# Patient Record
Sex: Male | Born: 1955 | Race: White | Hispanic: No | Marital: Married | Smoking: Current every day smoker
Health system: Southern US, Community
[De-identification: ages and names within clinical notes are randomized; demographics above are authoritative.]

## PROBLEM LIST (undated history)

## (undated) DIAGNOSIS — E119 Type 2 diabetes mellitus without complications: Secondary | ICD-10-CM

## (undated) DIAGNOSIS — R112 Nausea with vomiting, unspecified: Secondary | ICD-10-CM

## (undated) DIAGNOSIS — I10 Essential (primary) hypertension: Secondary | ICD-10-CM

## (undated) DIAGNOSIS — G4730 Sleep apnea, unspecified: Secondary | ICD-10-CM

## (undated) DIAGNOSIS — Z9889 Other specified postprocedural states: Secondary | ICD-10-CM

## (undated) HISTORY — PX: OTHER SURGICAL HISTORY: SHX169

---

## 2002-07-11 ENCOUNTER — Ambulatory Visit (HOSPITAL_BASED_OUTPATIENT_CLINIC_OR_DEPARTMENT_OTHER): Admission: RE | Admit: 2002-07-11 | Discharge: 2002-07-11 | Payer: Self-pay | Admitting: Emergency Medicine

## 2002-07-27 ENCOUNTER — Encounter: Payer: Self-pay | Admitting: Orthopedic Surgery

## 2002-07-30 ENCOUNTER — Ambulatory Visit (HOSPITAL_COMMUNITY): Admission: RE | Admit: 2002-07-30 | Discharge: 2002-07-31 | Payer: Self-pay | Admitting: *Deleted

## 2002-09-22 ENCOUNTER — Ambulatory Visit (HOSPITAL_BASED_OUTPATIENT_CLINIC_OR_DEPARTMENT_OTHER): Admission: RE | Admit: 2002-09-22 | Discharge: 2002-09-22 | Payer: Self-pay | Admitting: Emergency Medicine

## 2007-02-12 ENCOUNTER — Encounter: Admission: RE | Admit: 2007-02-12 | Discharge: 2007-02-12 | Payer: Self-pay | Admitting: Sports Medicine

## 2007-03-24 ENCOUNTER — Ambulatory Visit (HOSPITAL_BASED_OUTPATIENT_CLINIC_OR_DEPARTMENT_OTHER): Admission: RE | Admit: 2007-03-24 | Discharge: 2007-03-25 | Payer: Self-pay | Admitting: Orthopedic Surgery

## 2008-02-15 ENCOUNTER — Ambulatory Visit (HOSPITAL_BASED_OUTPATIENT_CLINIC_OR_DEPARTMENT_OTHER): Admission: RE | Admit: 2008-02-15 | Discharge: 2008-02-15 | Payer: Self-pay | Admitting: Orthopedic Surgery

## 2010-11-13 NOTE — Op Note (Signed)
NAMECASSANDRA, MCMANAMAN              ACCOUNT NO.:  [id number]   MEDICAL RECORD NO.:  [id number]          PATIENT TYPE:  AMB   LOCATION:  DSC                          FACILITY:  [facility]   PHYSICIAN:  Robert A. Thurston Hole, M.D. DATE OF BIRTH:  04-Sep-1955   DATE OF PROCEDURE:  02/15/2008  DATE OF DISCHARGE:                               OPERATIVE REPORT   PREOPERATIVE DIAGNOSES:  1. Left knee medial and lateral meniscal tears with chondromalacia and      synovitis.  2. Left knee gout.   POSTOPERATIVE DIAGNOSES:  1. Left knee medial and lateral meniscal tears with chondromalacia and      synovitis.  2. Left knee gout.   PROCEDURES:  1. Left knee examination under anesthesia followed by arthroscopic      partial medial and lateral meniscectomies.  2. Left knee chondroplasty with partial synovectomy.   SURGEON:  Elana Alm. Thurston Hole, MD   ANESTHESIA:  General.   OPERATIVE TIME:  40 minutes.   COMPLICATIONS:  None.   INDICATION FOR PROCEDURE:  Mr. Victor [name] is a 55 year old gentleman who has  had significant left knee pain over the past 6 to 8 weeks increasing in  nature, with exam and MRI documenting meniscal tearing with  chondromalacia, synovitis, and gout.  He has failed conservative care  and is now to undergo arthroscopy.   DESCRIPTION:  Mr. Honeyman was brought to the operating room on February 15, 2008, placed on operating table in supine position.  After an adequate  level general anesthesia was obtained his left knee was examined.  He  had full range of motion.  His knee was stable.  Ligamentous exam with  normal patellar tracking.  The knee was sterilely injected with 0.25%  Marcaine with epinephrine.  The left leg was then prepped using sterile  DuraPrep and draped using sterile technique.  Originally through an  anterolateral portal the arthroscope with a pump attached was placed in  through an anteromedial portal, and an arthroscopic probe was placed.  On initial inspection of the  medial compartment, the articular cartilage  showed 50% grade 3 chondromalacia, which was debrided.  Medial meniscus  tear, posterior and medial horn, of which 40-50% was resected back to a  stable rim.  The intercondylar notch inspected.  Anterior and posterior  crucial ligaments were normal.  Lateral compartment was inspected.  Articular cartilage which showed grade 1 and 2 chondromalacia.  There  were calcific crystal and gouty deposits on the articular surfaces  medially, laterally, and in the patellofemoral joint and in the menisci  as well, and these were partially debrided.  Lateral meniscus showed  tearing of the posterolateral corner, which was resected back to a  stable rim; 30% of the posterolateral corner was resected.  Patellofemoral joint showed 50% grade 3 chondromalacia on the  patellofemoral groove, and this was debrided.  The patella tracked  normally.  Moderate synovitis and medial and lateral gutters were  debrided, otherwise, this was free of pathology.  After this was done,  it was felt that all pathology had been satisfactorily addressed.  The  instruments were removed.  Portals were closed with 3-0 nylon suture and  injected with 0.25% Marcaine with epinephrine and 4 mg of morphine.  Sterile dressings were applied and the patient awakened and taken to the  recovery room in stable condition.   FOLLOWUP CARE:  Mr. Rowlette will be followed as an outpatient on Vicodin  and Mobic.  He will be seen back in office in a week for sutures out and  followup.      Robert A. Thurston Hole, M.D.  Electronically Signed     RAW/MEDQ  D:  02/15/2008  T:  02/16/2008  Job:  [id number]

## 2010-11-13 NOTE — Op Note (Signed)
[name], [name]              ACCOUNT NO.:  [id number]   MEDICAL RECORD NO.:  [id number]          PATIENT TYPE:  AMB   LOCATION:  DSC                          FACILITY:  [facility]   PHYSICIAN:  Robert A. Thurston Hole, M.D. DATE OF BIRTH:  1956-01-03   DATE OF PROCEDURE:  03/24/2007  DATE OF DISCHARGE:                               OPERATIVE REPORT   PREOPERATIVE DIAGNOSIS:  Right shoulder labrum tear with a partial  rotator cuff and impingement.   POSTOPERATIVE DIAGNOSES:  1. Right shoulder rotator cuff tear.  2. Right shoulder partial labrum tear.  3. Right shoulder impingement.  4. 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   PROCEDURES:  1. Right shoulder evaluation under anesthesia followed by an      arthroscopically assisted rotator cuff repair using Arthrex suture      anchors x2.  2. Right shoulder debridement partial labrum tear.  3. Right shoulder subacromial decompression.  4. Right shoulder distal clavicle excision.   SURGEON:  Elana Alm. Thurston Hole, M.D.   ASSISTANT:  Julien Girt, P.A.   ANESTHESIA:  General.   OPERATIVE TIME:  One hour and ten minutes.   COMPLICATIONS:  None.   INDICATIONS FOR PROCEDURE:  Victor [name] is a 55 year old gentleman who has  had significant right shoulder pain for the past 3 to 4 months,  increasing in nature with exam and MRI documenting partial rotator cuff  tear, partial labrum tear with impingement and AC joint arthropathy.  He  has failed conservative care and is now to undergo arthroscopy and  repair.   DESCRIPTION:  Victor [name] is brought in the operating room on March 24, 2007 after an interscalene block was placed in the holding room by  anesthesia.  He was placed on the operative table in the supine  position.  After being placed under general anesthesia, his right  shoulder was examined.  He had full range of motion and his shoulder was  stable to ligamentous exam.  He is  then placed in a beach-chair position  and his shoulder and arm were prepped using sterile DuraPrep and draped  using sterile technique.  He received Ancef 1 gram IV preoperatively for  prophylaxis.  Originally through a posterior arthroscopic portal, the  arthroscope with the pump attached was placed into an anterior portal  and arthroscopic probe was placed.  On initial inspection, the articular  cartilage in the glenohumeral joint was found to be intact.  He had  partial tearing of the anterior, superior and posterior superior labrum  25-30%, which was debrided.  The biceps tendon anchor was intact.  Biceps tendon showed partial tearing 25%, which was debrided.  The  anterior-inferior labrum and anterior-inferior glenohumeral ligament  complex was intact.  Rotator cuff had a complete tear of the  supraspinatus and a partial tear of the infraspinatus and this was  partially debrided arthroscopically.  Inferior capsular recess is free  of pathology.  Subacromial space was entered and a lateral arthroscopic  portal was made.  Moderately thickened bursitis was resected.  The  rotator  cuff tear was well visualized and was further debrided  arthroscopically.  Impingement was noted and a subacromial decompression  was carried out removing 6-8 mm of the undersurface of the anterior,  anterolateral and anteromedial acromion and CA ligament release carried  out as well.  The [facility] joint showed significant  spurring and degenerative  changes in distal 6 mm of the clavicle was resected with a 6-mm bur.  After this was done, then through an accessory lateral portal, 2  separate Arthrex 5.5 mm suture anchors were placed in the greater  tuberosity.  Each of these anchors had 2 sutures in them and each of  these sutures was passed arthroscopically through the rotator cuff tear  and then tied down arthroscopically with a firm and tight repair.  After  this was done, the shoulder could be brought through a  full range of  motion with no impingement on the repair.   At this point, it was felt that all pathology had been satisfactorily  addressed.  The instruments were removed.  Portals were closed with 3-0  nylon suture.  Sterile dressings and a sling applied.  The patient  awakened and taken to the recovery room in stable condition.   FOLLOW-UP CARE:  Victor [name] will be followed as an outpatient on Percocet  and Robaxin with early physical therapy.  See me back in the office in a  week for sutures out and followup.      Robert A. Thurston Hole, M.D.  Electronically Signed     RAW/MEDQ  D:  03/24/2007  T:  03/24/2007  Job:  [id number]

## 2010-11-16 NOTE — Op Note (Signed)
NAME:  Victor [name], Victor [name]                        ACCOUNT NO.:  [id number]   MEDICAL RECORD NO.:  [id number]                   PATIENT TYPE:  OIB   LOCATION:  4731                                 FACILITY:  [facility]   PHYSICIAN:  Robert A. Thurston Hole, M.D.              DATE OF BIRTH:  1956-06-02   DATE OF PROCEDURE:  07/30/2002  DATE OF DISCHARGE:                                 OPERATIVE REPORT   PREOPERATIVE DIAGNOSIS:  Left shoulder rotator cuff tear.   POSTOPERATIVE DIAGNOSES:  Left shoulder rotator cuff tear with partial  glenoid ligament tear and impingement.   PROCEDURE:  1. Left shoulder EUA followed by arthroscopic partial ligament tear     debridement.  2. Left shoulder rotator cuff repair using Arthrex suture anchors x2.  3. Left shoulder subacromial decompression.   SURGEON:  Dr. Salvatore Marvel.   ASSISTANT:  Kirstin Tomasa Rand, P.A.   ANESTHESIA:  General.   OPERATIVE TIME:  One hour and 20 minutes.   COMPLICATIONS:  None.   DESCRIPTION OF PROCEDURE:  Mr. Mathenia was brought to the operating room on  July 30, 2002 after a intra ganglion block had been placed by Anesthesia  in the holding room.  He was placed on the operative table in supine  position.  After being placed under general anesthesia his left shoulder was  examined under anesthesia.  He had full range of motion and his shoulder was  stable to ligamentous exam.  He was then placed in beach chair position and  his shoulder and arm were prepped using Duraprep and draped using sterile  technique.  Initially through a posterior arthroscopic portal the  arthroscope with a pump attached was placed in to an angio portal and  arthroscopic probe was placed.  On initial inspection the articular  cartilage in the glenohumeral joint was intact.  Anterior labrum showed  partial tearing 25% which was debrided.  Inferior labrum and anterior  inferior glenohumeral ligament complex was intact.  The superior labrum  biceps  tendon anchor was intact, biceps tendon was intact.  The posterior  labrum showed no evidence of pathology.  Rotator cuff showed a complete tear  of the supraspinatus, partial tear of the subscapularis and infraspinatus  and this was partially debrided arthroscopically.  A lateral arthroscopic  portal was made and the subacromial space was entered.  Moderately thickened  bursitis was resected.  The rotator cuff tear was well visualized and  further debrided arthroscopically.  Subacromial decompression was carried  out removing six to eight millimeters of the under surface of the anterior,  anterior lateral and anterior medial acromion and CA ligament release  carried out as well.  The [facility] joint was not disturbed.  After this was done  the lateral portal was converted to a four centimeter deltoid splitting  longitudinal incision.  The underlying subcutaneous tissues were incised  mild skin incision.  The subacromial space was  entered.  The rotator cuff  tear was well visualized.  Two separate Arthrex suture anchors were placed  in the greater tuberosity and each of these had the sutures passed through  the rotator cuff tear and tied down securing the rotator cuff back down to  the greater tuberosity.  The subscapularis and supraspinatus tendons were  also tied separately and sutured to each other side to side with #2  Fibrewire suture.  After this was done the shoulder could be brought through  a full range of motion with no impingement on the repair.  The wound was  then irrigated and closed using 0 Vicryl and closed the deltoid fascia with  2-0 Vicryl and closed the subcutaneous tissues with 3-0 Prolene for the  subcuticular layer.  Arthroscopic portal was closed with 3-0 nylon.  Sterile  dressings and a sling applied and then the patient awakened and taken to the  recovery room in stable condition.  Needle and sponge counts correct x2 at  the end of the case.                                                Robert A. Thurston Hole, M.D.    RAW/MEDQ  D:  07/30/2002  T:  07/30/2002  Job:  [id number]

## 2011-03-29 LAB — BASIC METABOLIC PANEL
BUN: 15
CO2: 25
Calcium: 9.1
Chloride: 107
Creatinine, Ser: 0.89
GFR calc Af Amer: >60
GFR calc non Af Amer: >60
Glucose, Bld: 137 — ABNORMAL HIGH
Potassium: 3.9
Sodium: 139

## 2011-04-11 LAB — POCT HEMOGLOBIN-HEMACUE
Hemoglobin: 15.5
Operator id: 112821

## 2011-04-11 LAB — BASIC METABOLIC PANEL
BUN: 14
CO2: 26
Calcium: 9.3
Chloride: 104
Creatinine, Ser: 0.92
GFR calc Af Amer: >60
GFR calc non Af Amer: >60
Glucose, Bld: 105 — ABNORMAL HIGH
Potassium: 3.8
Sodium: 137

## 2015-07-06 ENCOUNTER — Other Ambulatory Visit: Payer: Self-pay | Admitting: Gastroenterology

## 2015-07-19 ENCOUNTER — Encounter (HOSPITAL_COMMUNITY): Payer: Self-pay | Admitting: *Deleted

## 2015-07-24 ENCOUNTER — Encounter (HOSPITAL_COMMUNITY): Payer: Self-pay | Admitting: Anesthesiology

## 2015-07-24 NOTE — Anesthesia Preprocedure Evaluation (Addendum)
Anesthesia Evaluation  Patient identified by MRN, date of birth, ID band Patient awake    Reviewed: Allergy & Precautions, NPO status , Patient's Chart, lab work & pertinent test results  History of Anesthesia Complications (+) PONV and history of anesthetic complications  Airway Mallampati: II  TM Distance: >3 FB Neck ROM: Full    Dental no notable dental hx.    Pulmonary sleep apnea , Current Smoker,    Pulmonary exam normal breath sounds clear to auscultation       Cardiovascular hypertension, Pt. on medications Normal cardiovascular exam Rhythm:Regular Rate:Normal     Neuro/Psych negative neurological ROS  negative psych ROS   GI/Hepatic negative GI ROS, Neg liver ROS,   Endo/Other  diabetes, Type 2, Oral Hypoglycemic Agents  Renal/GU negative Renal ROS  negative genitourinary   Musculoskeletal negative musculoskeletal ROS (+)   Abdominal   Peds negative pediatric ROS (+)  Hematology negative hematology ROS (+)   Anesthesia Other Findings   Reproductive/Obstetrics negative OB ROS                            Anesthesia Physical Anesthesia Plan  ASA: III  Anesthesia Plan: MAC   Post-op Pain Management:    Induction: Intravenous  Airway Management Planned: Natural Airway  Additional Equipment:   Intra-op Plan:   Post-operative Plan:   Informed Consent: I have reviewed the patients History and Physical, chart, labs and discussed the procedure including the risks, benefits and alternatives for the proposed anesthesia with the patient or authorized representative who has indicated his/her understanding and acceptance.   Dental advisory given  Plan Discussed with: CRNA  Anesthesia Plan Comments:         Anesthesia Quick Evaluation

## 2015-07-25 ENCOUNTER — Encounter (HOSPITAL_COMMUNITY): Payer: Self-pay

## 2015-07-25 ENCOUNTER — Ambulatory Visit (HOSPITAL_COMMUNITY): Payer: BLUE CROSS/BLUE SHIELD | Admitting: Anesthesiology

## 2015-07-25 ENCOUNTER — Encounter (HOSPITAL_COMMUNITY)
Admission: RE | Disposition: A | Discharge status: Home / Self Care | Payer: Self-pay | Source: Ambulatory Visit | Attending: Gastroenterology

## 2015-07-25 ENCOUNTER — Ambulatory Visit (HOSPITAL_COMMUNITY)
Admission: RE | Admit: 2015-07-25 | Discharge: 2015-07-25 | Disposition: A | Discharge status: Home / Self Care | Payer: BLUE CROSS/BLUE SHIELD | Source: Ambulatory Visit | Attending: Gastroenterology | Admitting: Gastroenterology

## 2015-07-25 DIAGNOSIS — D124 Benign neoplasm of descending colon: Secondary | ICD-10-CM | POA: Diagnosis not present

## 2015-07-25 DIAGNOSIS — I10 Essential (primary) hypertension: Secondary | ICD-10-CM | POA: Insufficient documentation

## 2015-07-25 DIAGNOSIS — Z6841 Body Mass Index (BMI) 40.0 and over, adult: Secondary | ICD-10-CM | POA: Diagnosis not present

## 2015-07-25 DIAGNOSIS — G4730 Sleep apnea, unspecified: Secondary | ICD-10-CM | POA: Insufficient documentation

## 2015-07-25 DIAGNOSIS — K5730 Diverticulosis of large intestine without perforation or abscess without bleeding: Secondary | ICD-10-CM | POA: Diagnosis not present

## 2015-07-25 DIAGNOSIS — F17210 Nicotine dependence, cigarettes, uncomplicated: Secondary | ICD-10-CM | POA: Insufficient documentation

## 2015-07-25 DIAGNOSIS — Z7984 Long term (current) use of oral hypoglycemic drugs: Secondary | ICD-10-CM | POA: Diagnosis not present

## 2015-07-25 DIAGNOSIS — Z79899 Other long term (current) drug therapy: Secondary | ICD-10-CM | POA: Insufficient documentation

## 2015-07-25 DIAGNOSIS — D122 Benign neoplasm of ascending colon: Secondary | ICD-10-CM | POA: Diagnosis not present

## 2015-07-25 DIAGNOSIS — Z86010 Personal history of colonic polyps: Secondary | ICD-10-CM | POA: Diagnosis not present

## 2015-07-25 DIAGNOSIS — Z1211 Encounter for screening for malignant neoplasm of colon: Secondary | ICD-10-CM | POA: Diagnosis present

## 2015-07-25 DIAGNOSIS — Z7982 Long term (current) use of aspirin: Secondary | ICD-10-CM | POA: Diagnosis not present

## 2015-07-25 DIAGNOSIS — E119 Type 2 diabetes mellitus without complications: Secondary | ICD-10-CM | POA: Diagnosis not present

## 2015-07-25 HISTORY — PX: COLONOSCOPY WITH PROPOFOL: SHX5780

## 2015-07-25 HISTORY — DX: Essential (primary) hypertension: I10

## 2015-07-25 HISTORY — DX: Nausea with vomiting, unspecified: Z98.890

## 2015-07-25 HISTORY — DX: Type 2 diabetes mellitus without complications: E11.9

## 2015-07-25 HISTORY — DX: Nausea with vomiting, unspecified: R11.2

## 2015-07-25 HISTORY — DX: Sleep apnea, unspecified: G47.30

## 2015-07-25 LAB — GLUCOSE, CAPILLARY: GLUCOSE-CAPILLARY: 105 mg/dL — AB (ref 65–99)

## 2015-07-25 SURGERY — COLONOSCOPY WITH PROPOFOL
Anesthesia: Monitor Anesthesia Care

## 2015-07-25 MED ORDER — SODIUM CHLORIDE 0.9 % IV SOLN: INTRAVENOUS | Status: DC

## 2015-07-25 MED ORDER — PROPOFOL 10 MG/ML IV BOLUS
INTRAVENOUS | Status: DC | PRN
  Administered 2015-07-25: 40 mg via INTRAVENOUS
  Administered 2015-07-25: 30 mg via INTRAVENOUS
  Administered 2015-07-25: 60 mg via INTRAVENOUS
  Administered 2015-07-25: 20 mg via INTRAVENOUS
  Administered 2015-07-25: 30 mg via INTRAVENOUS
  Administered 2015-07-25: 40 mg via INTRAVENOUS
  Administered 2015-07-25: 30 mg via INTRAVENOUS

## 2015-07-25 MED ORDER — EPHEDRINE SULFATE 50 MG/ML IJ SOLN
INTRAMUSCULAR | Status: AC
  Filled 2015-07-25: qty 1

## 2015-07-25 MED ORDER — LIDOCAINE HCL (CARDIAC) 20 MG/ML IV SOLN
INTRAVENOUS | Status: AC
  Filled 2015-07-25: qty 5

## 2015-07-25 MED ORDER — LACTATED RINGERS IV SOLN
INTRAVENOUS | Status: DC
  Administered 2015-07-25: 07:00:00 via INTRAVENOUS

## 2015-07-25 MED ORDER — SODIUM CHLORIDE 0.9 % IJ SOLN
INTRAMUSCULAR | Status: AC
  Filled 2015-07-25: qty 10

## 2015-07-25 MED ORDER — LIDOCAINE HCL (CARDIAC) 20 MG/ML IV SOLN
INTRAVENOUS | Status: DC | PRN
  Administered 2015-07-25: 50 mg via INTRAVENOUS

## 2015-07-25 MED ORDER — PROPOFOL 10 MG/ML IV BOLUS
INTRAVENOUS | Status: AC
  Filled 2015-07-25: qty 60

## 2015-07-25 SURGICAL SUPPLY — 21 items

## 2015-07-25 NOTE — Transfer of Care (Signed)
Immediate Anesthesia Transfer of Care Note  Patient: Victor [name]  Procedure(s) Performed: Procedure(s): COLONOSCOPY WITH PROPOFOL (N/A)  Patient Location: PACU  Anesthesia Type:MAC  Level of Consciousness: Patient easily awoken, sedated, comfortable, cooperative, following commands, responds to stimulation.   Airway & Oxygen Therapy: Patient spontaneously breathing, ventilating well, oxygen via simple oxygen mask.  Post-op Assessment: Report given to PACU RN, vital signs reviewed and stable, moving all extremities.   Post vital signs: Reviewed and stable.  Complications: No apparent anesthesia complications

## 2015-07-25 NOTE — Anesthesia Postprocedure Evaluation (Signed)
Anesthesia Post Note  Patient: Victor [name]  Procedure(s) Performed: Procedure(s) (LRB): COLONOSCOPY WITH PROPOFOL (N/A)  Patient location during evaluation: PACU Anesthesia Type: MAC Level of consciousness: awake and alert Pain management: pain level controlled Vital Signs Assessment: post-procedure vital signs reviewed and stable Respiratory status: spontaneous breathing, nonlabored ventilation, respiratory function stable and patient connected to nasal cannula oxygen Cardiovascular status: stable and blood pressure returned to baseline Anesthetic complications: no    Last Vitals:  Filed Vitals:   07/25/15 0810 07/25/15 0820  BP: 146/74 142/60  Pulse: 74 65  Temp:    Resp: 27 17    Last Pain: There were no vitals filed for this visit.               Victor [name] J

## 2015-07-25 NOTE — Discharge Instructions (Signed)

## 2015-07-25 NOTE — Op Note (Signed)
[facility] [place], [id number]   OPERATIVE PROCEDURE REPORT  PATIENT: Victor [name], Victor [name]  MR#: [id number] BIRTHDATE: 23-Apr-1956 GENDER: male ENDOSCOPIST: Edmonia James, MD ASSISTANT:   William Dalton, technician & Laverta Baltimore, RN. PROCEDURE DATE: 2015/08/03 PRE-PROCEDURE PREPARATION: Patient fasted for 4 hours prior to procedure. The patient was prepped with a gallon of Golytely the night prior to the procedure. PRE-PROCEDURE PHYSICAL: Patient has stable vital signs.  Neck is supple.  There is no JVD, thyromegaly or LAD.  Chest clear to auscultation.  S1 and S2 regular.  Abdomen soft, non-distended, non-tender with NABS. PROCEDURE:     Colonoscopy with cold biopsies x 2 and cold snare polypectomy x 1. ASA CLASS:     Class III INDICATIONS:     1.  Colorectal cancer screening-average risk patient for colon cancer. MEDICATIONS:     Monitored anesthesia care  DESCRIPTION OF PROCEDURE: After the risks, benefits, and alternatives of the procedure were thoroughly explained [including a 10% missed rate of cancer and polyps], informed consent was obtained. Digital rectal exam was performed. The Pentax video colonoscpe [id number]  was introduced through the anus  and advanced to the cecum , limited by No adverse events experienced.   The quality of the prep was adequate. Multiple washes were done. Small lesions could be missed. The instrument was then slowly withdrawn as the colon was fully examined. Estimated blood loss is zero unless otherwise noted in this procedure report.     COLON FINDINGS: A 6 mm sessile polyp was found in the proximal ascending colon. A polypectomy was performed with a cold snare. The resection was complete, the polyp tissue was completely retrieved and sent to histology.   There was moderate diverticulosis noted in the sigmoid colon. Two dimunitive polyps were removed by cold biopsies from the sigmoid colon. The rest  of the colonic mucosa appeared healthy with a normal vascular pattern. No masses or AVMs were noted.  The appendiceal orifice and the ICV were identified and photographed. Retroflexed views revealed no abnormalities. The patient tolerated the procedure without immediate complications.  The scope was then withdrawn from the patient and the procedure terminated.   TIME TO CECUM:   3 minutes 00 seconds WITHDRAW TIME:  9 minutes 00 seconds  IMPRESSION:     1.  One 6 mm sessile polyp was found in the proximal ascending colon; polypectomy was performed with a cold snare 2.  Two dimunitive polyps in the sigmoid colon removed by cold biopsies x 2. 3    Moderate sigmoid divertiuclosis.  RECOMMENDATIONS:     1.  Hold Aspirin and all other NSAIDS for 2 weeks. 2.  Continue current medications. 3.  Continue surveillance. 4.  High fiber, low fat diet with liberal fluid intake. 5.  OP follow-up is advised on a PRN basis.  REPEAT EXAM:      In 5 years  for a repeat colonoscopy.  If the patient has any abnormal GI symptoms in the interim, he have been advised to contact the office as soon as possible for further recommendations.   REFERRED BY:  Gwenlyn Perking NP eSigned:  Edmonia James, MD 08-03-2015 8:13 AM  CPT CODES:     [phone] Colonoscopy, flexible, proximal to splenic flexure; with removal of tumor(s), polyp(s), or other lesion(s) by snare technique 45380 Colonoscopy, flexible, proximal to splenic flexure; with biopsy, single or multiple ICD CODES:     Z12.11 Encounter for screening for malignant neoplasm of colon  D12.2 Benign neoplasm of ascending colon, K57.30 Dverticulosis  The ICD and CPT codes recommended by this software are interpretations from the data that the clinical staff has captured with the software.  The verification of the translation of this report to the ICD and CPT codes and modifiers is the sole responsibility of the health care institution and  practicing physician where this report was generated.  [employer] will not be held responsible for the validity of the ICD and CPT codes included on this report.  AMA assumes no liability for data contained or not contained herein. CPT is a Designer, television/film set of the [employer].  PATIENT NAME:  Victor [name], Victor [name] MR#: [id number]

## 2015-07-25 NOTE — H&P (Signed)
Victor [name] is an 60 y.o. male.   Chief Complaint: Colorectal cancer screening. HPI: 60 year old,orbidly obese white male here for a screening colonoscopy. He has had a tubular adenoma removed in 2000. See office notes for details   Past Medical History  Diagnosis Date  . Hypertension   . Diabetes mellitus without complication ([place])   . Sleep apnea     uses setting 11.5  . PONV (postoperative nausea and vomiting)     Morbid obesity Past Surgical History  Procedure Laterality Date  . Rotator cuff Bilateral   . Meniscus tendon repair Left    History reviewed. No pertinent family history. Social History:  reports that he has been smoking Cigarettes.  He has a 60 pack-year smoking history. He has never used smokeless tobacco. He reports that he drinks alcohol. He reports that he does not use illicit drugs.  Allergies: No Known Allergies  Medications Prior to Admission  Medication Sig Dispense Refill  . amLODipine (NORVASC) 10 MG tablet Take 10 mg by mouth daily.     Marland Kitchen aspirin EC 81 MG tablet Take 81 mg by mouth daily.    Marland Kitchen dextromethorphan (DELSYM) 30 MG/5ML liquid Take 30 mg by mouth 2 (two) times daily as needed for cough.    Marland Kitchen ibuprofen (ADVIL,MOTRIN) 200 MG tablet Take 400 mg by mouth every 6 (six) hours as needed (Pain).    Marland Kitchen losartan (COZAAR) 100 MG tablet Take 100 mg by mouth daily.     . metFORMIN (GLUCOPHAGE-XR) 500 MG 24 hr tablet Take 500 mg by mouth daily with breakfast.     . Pseudoephedrine-APAP-DM (DAYQUIL MULTI-SYMPTOM COLD/FLU PO) Take 1 tablet by mouth daily as needed (Cold symptoms).    . tolnaftate (JOCK ITCH SPRAY) 1 % spray Apply 1 application topically 2 (two) times daily as needed (Jock Itch).      Results for orders placed or performed during the hospital encounter of 07/25/15 (from the past 48 hour(s))  Glucose, capillary     Status: Abnormal   Collection Time: 07/25/15  6:57 AM  Result Value Ref Range   Glucose-Capillary 105 (H) 65 - 99 mg/dL    Review of Systems  Constitutional: Negative.   HENT: Negative.   Eyes: Negative.   Respiratory: Negative.   Gastrointestinal: Negative.   Genitourinary: Negative.   Musculoskeletal: Positive for joint pain. Negative for myalgias, back pain, falls and neck pain.  Skin: Negative.   Neurological: Negative.   Endo/Heme/Allergies: Negative.   Psychiatric/Behavioral: Negative.    Pulse 80, temperature 98.1 F (36.7 C), temperature source Oral, resp. rate 10, height 6\' 1"  (1.854 m), weight 165.563 kg (365 lb), SpO2 93 %. Physical Exam  Constitutional: He is oriented to person, place, and time. He appears well-developed and well-nourished.  Morbidly obese  HENT:  Head: Normocephalic and atraumatic.  Eyes: Conjunctivae and EOM are normal. Pupils are equal, round, and reactive to light.  Neck: Normal range of motion. Neck supple.  Cardiovascular: Normal rate and regular rhythm.   Respiratory: Effort normal and breath sounds normal.  GI: Soft. Bowel sounds are normal.  Musculoskeletal: Normal range of motion.  Neurological: He is alert and oriented to person, place, and time.  Skin: Skin is warm and dry.  Psychiatric: He has a normal mood and affect. His behavior is normal. Judgment and thought content normal.    Assessment/Plan Colorectal cancer screening: proceed with a colonoscopy at this time.  Eddye Broxterman 07/25/2015, 7:12 AM

## 2015-07-26 ENCOUNTER — Encounter (HOSPITAL_COMMUNITY): Payer: Self-pay | Admitting: Gastroenterology

## 2018-08-20 ENCOUNTER — Other Ambulatory Visit: Payer: Self-pay | Admitting: Physician Assistant

## 2018-08-20 DIAGNOSIS — Z720 Tobacco use: Secondary | ICD-10-CM

## 2018-09-10 ENCOUNTER — Ambulatory Visit
Admission: RE | Admit: 2018-09-10 | Discharge: 2018-09-10 | Disposition: A | Discharge status: Home / Self Care | Payer: BLUE CROSS/BLUE SHIELD | Source: Ambulatory Visit | Attending: Physician Assistant | Admitting: Physician Assistant

## 2018-09-10 ENCOUNTER — Other Ambulatory Visit: Payer: Self-pay

## 2018-09-10 DIAGNOSIS — Z720 Tobacco use: Secondary | ICD-10-CM

## 2020-08-09 IMAGING — CT CT CHEST LUNG CANCER SCREENING LOW DOSE
1 of 2 series · 10 of 40 positions shown, 13 images · non-contrast
Comparison: None.

CLINICAL DATA: Thirty pack-year smoking history. Current smoker.

EXAM:
CT CHEST WITHOUT CONTRAST LOW-DOSE FOR LUNG CANCER SCREENING
TECHNIQUE: Multidetector CT imaging of the chest was performed following the
standard protocol without IV contrast.

[ct lung segmentation data · axial · 0.79mm/px · z∈[-350,-350]mm · 10 of 329 frames shown]
[frame 1/329  mediastinal]
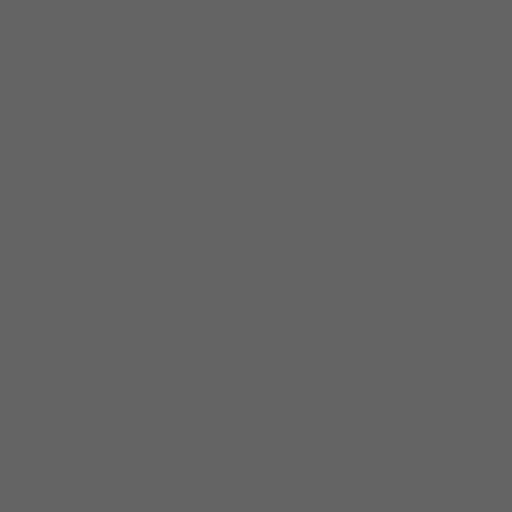
[frame 1/329  lung]
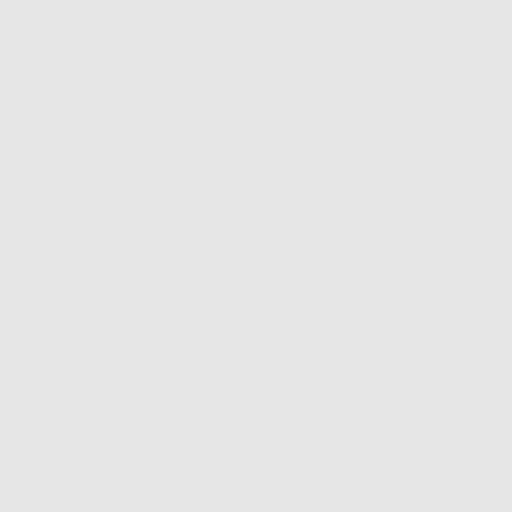
[frame 37/329  lung]
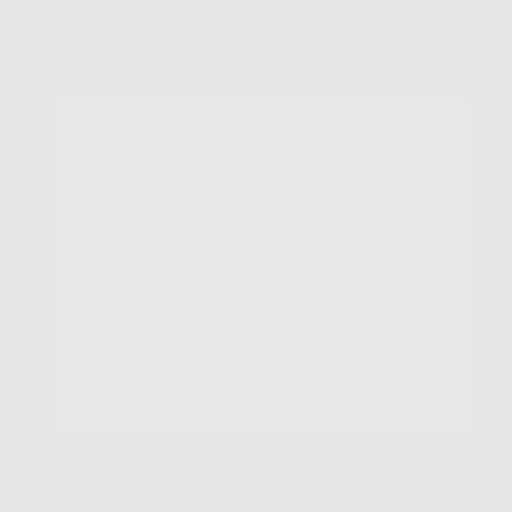
[frame 73/329  lung]
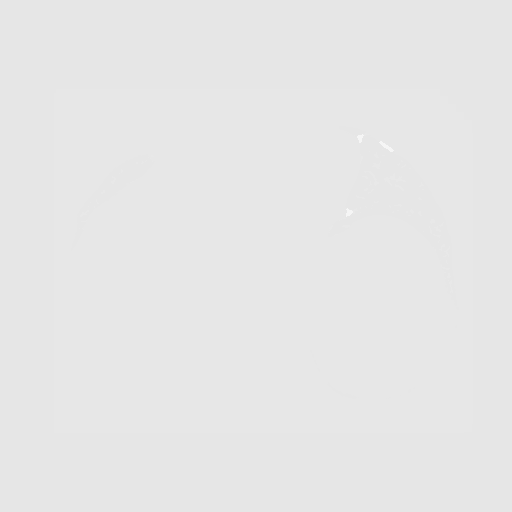
[frame 110/329  lung]
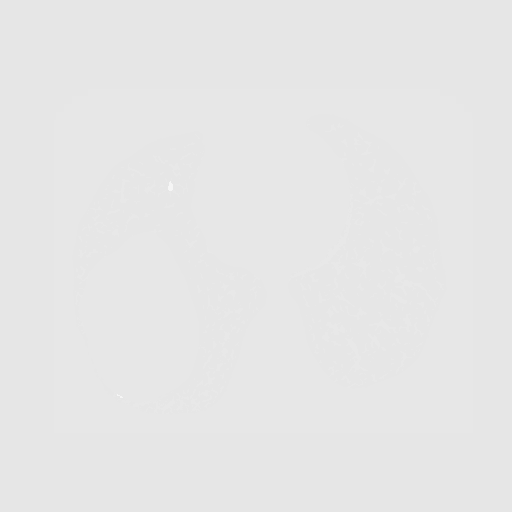
[frame 146/329  mediastinal]
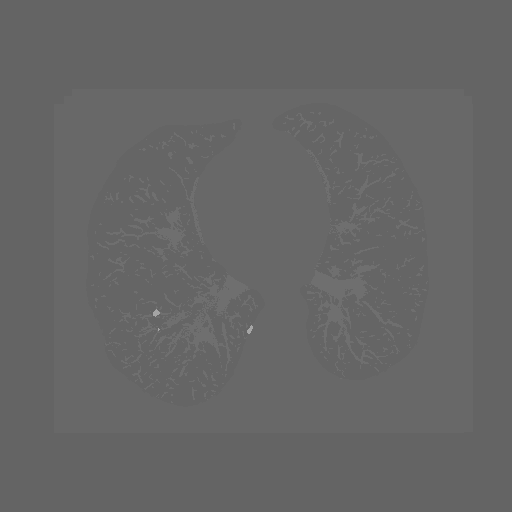
[frame 146/329  lung]
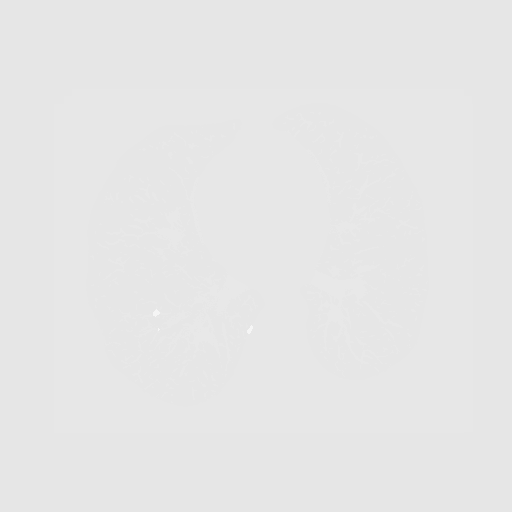
[frame 183/329  lung]
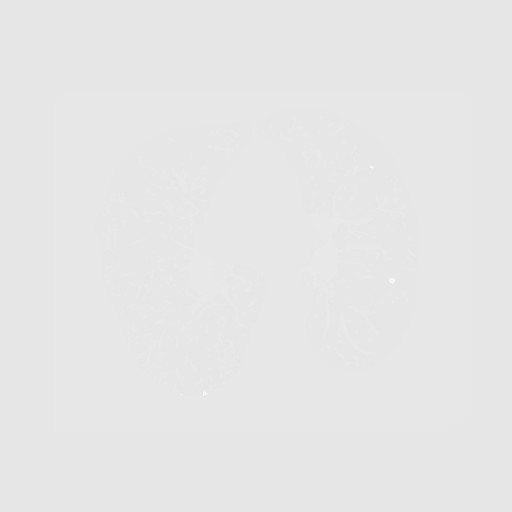
[frame 219/329  lung]
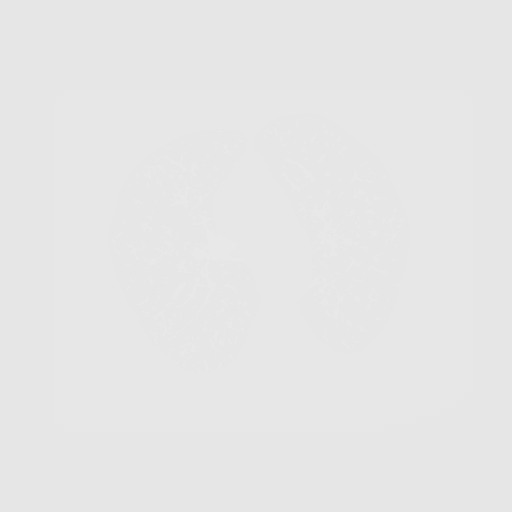
[frame 256/329  lung]
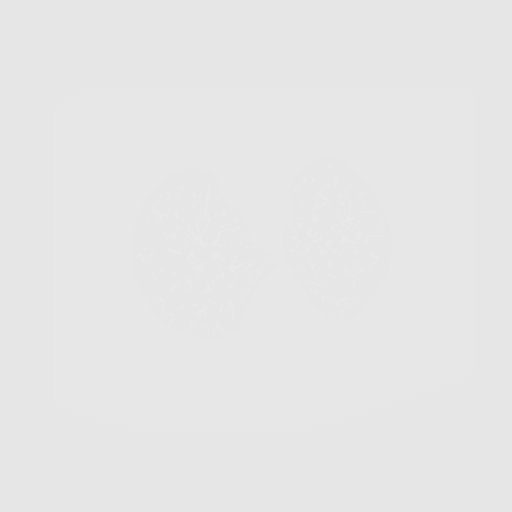
[frame 292/329  mediastinal]
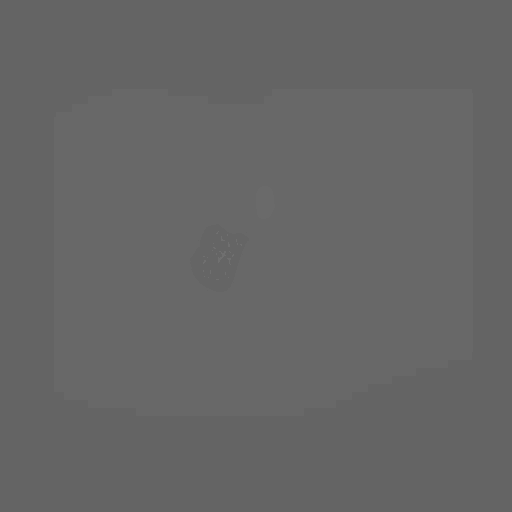
[frame 292/329  lung]
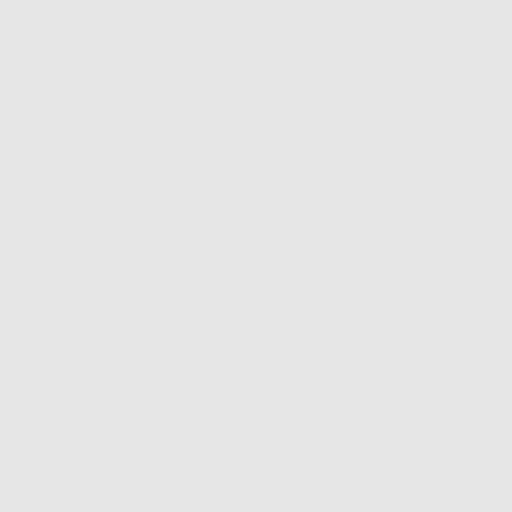
[frame 329/329  lung]
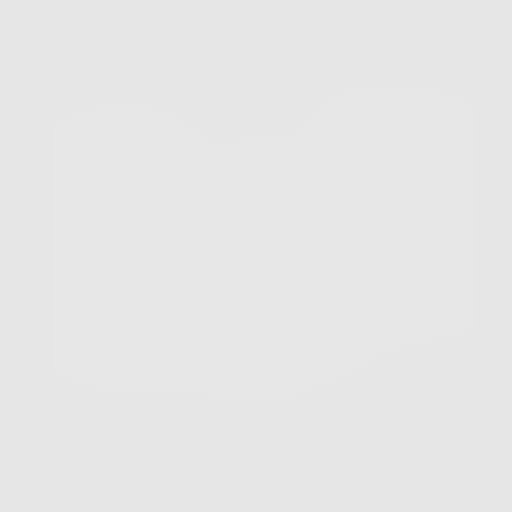

[10 of 40 positions shown; findings below may reference images not displayed]

FINDINGS: Cardiovascular: Aortic and branch vessel atherosclerosis. Tortuous
thoracic aorta. Normal heart size with minimal anterior pericardial
thickening. Lad coronary artery calcification is mild.

Mediastinum/Nodes: No mediastinal or definite hilar adenopathy,
given limitations of unenhanced CT.

Lungs/Pleura: No pleural fluid. Mild centrilobular emphysema. Mild
motion degradation inferiorly.

Mild upper lobe predominant micronodularity most likely represents
respiratory bronchiolitis. No macroscopic pulmonary nodules
identified.

Upper Abdomen: Normal imaged portions of the liver, spleen, stomach,
pancreas, adrenal glands, kidneys.

Musculoskeletal: Degenerate changes of both shoulders.
IMPRESSION: 1. Lung-RADS 1, negative. Continue annual screening with low-dose
chest CT without contrast in 12 months.
2. Upper lobe predominant micronodularity likely is smoking related
(i.e. Respiratory bronchiolitis).
3. Mild motion degradation inferiorly

Aortic atherosclerosis ([id number]-[id number].J), coronary artery
atherosclerosis and emphysema ([id number]).

## 2021-02-12 ENCOUNTER — Other Ambulatory Visit: Payer: Self-pay | Admitting: Family Medicine

## 2021-02-12 ENCOUNTER — Other Ambulatory Visit (HOSPITAL_COMMUNITY): Payer: Self-pay | Admitting: Family Medicine

## 2021-02-12 DIAGNOSIS — R188 Other ascites: Secondary | ICD-10-CM

## 2021-02-13 ENCOUNTER — Other Ambulatory Visit: Payer: Self-pay | Admitting: Family Medicine

## 2021-02-13 ENCOUNTER — Other Ambulatory Visit: Payer: Self-pay

## 2021-02-13 ENCOUNTER — Ambulatory Visit
Admission: RE | Admit: 2021-02-13 | Discharge: 2021-02-13 | Disposition: A | Discharge status: Home / Self Care | Payer: BC Managed Care – PPO | Source: Ambulatory Visit | Attending: Family Medicine | Admitting: Family Medicine

## 2021-02-13 ENCOUNTER — Ambulatory Visit (HOSPITAL_COMMUNITY): Payer: BC Managed Care – PPO

## 2021-02-13 DIAGNOSIS — R188 Other ascites: Secondary | ICD-10-CM

## 2021-02-13 MED ORDER — IOPAMIDOL (ISOVUE-300) INJECTION 61%
100.0000 mL | Freq: Once | INTRAVENOUS | Status: AC | PRN
  Administered 2021-02-13: 100 mL via INTRAVENOUS

## 2021-02-14 ENCOUNTER — Encounter (HOSPITAL_COMMUNITY): Payer: Self-pay

## 2021-02-14 ENCOUNTER — Ambulatory Visit (HOSPITAL_COMMUNITY): Payer: BC Managed Care – PPO

## 2022-07-11 ENCOUNTER — Other Ambulatory Visit: Payer: Self-pay | Admitting: Gastroenterology

## 2022-09-20 ENCOUNTER — Encounter (HOSPITAL_COMMUNITY): Payer: Self-pay | Admitting: Gastroenterology

## 2022-09-27 ENCOUNTER — Encounter (HOSPITAL_COMMUNITY): Payer: Self-pay | Admitting: Gastroenterology

## 2022-09-27 ENCOUNTER — Ambulatory Visit (HOSPITAL_COMMUNITY)
Admission: RE | Admit: 2022-09-27 | Discharge: 2022-09-27 | Disposition: A | Discharge status: Home / Self Care | Payer: BC Managed Care – PPO | Attending: Gastroenterology | Admitting: Gastroenterology

## 2022-09-27 ENCOUNTER — Other Ambulatory Visit: Payer: Self-pay

## 2022-09-27 ENCOUNTER — Ambulatory Visit (HOSPITAL_COMMUNITY): Payer: BC Managed Care – PPO | Admitting: Certified Registered Nurse Anesthetist

## 2022-09-27 ENCOUNTER — Encounter (HOSPITAL_COMMUNITY)
Admission: RE | Disposition: A | Discharge status: Home / Self Care | Payer: Self-pay | Source: Home / Self Care | Attending: Gastroenterology

## 2022-09-27 DIAGNOSIS — Z79899 Other long term (current) drug therapy: Secondary | ICD-10-CM | POA: Diagnosis not present

## 2022-09-27 DIAGNOSIS — I110 Hypertensive heart disease with heart failure: Secondary | ICD-10-CM | POA: Diagnosis not present

## 2022-09-27 DIAGNOSIS — I4891 Unspecified atrial fibrillation: Secondary | ICD-10-CM | POA: Diagnosis not present

## 2022-09-27 DIAGNOSIS — I509 Heart failure, unspecified: Secondary | ICD-10-CM | POA: Diagnosis not present

## 2022-09-27 DIAGNOSIS — Z7984 Long term (current) use of oral hypoglycemic drugs: Secondary | ICD-10-CM | POA: Diagnosis not present

## 2022-09-27 DIAGNOSIS — K5730 Diverticulosis of large intestine without perforation or abscess without bleeding: Secondary | ICD-10-CM | POA: Insufficient documentation

## 2022-09-27 DIAGNOSIS — Z7985 Long-term (current) use of injectable non-insulin antidiabetic drugs: Secondary | ICD-10-CM | POA: Diagnosis not present

## 2022-09-27 DIAGNOSIS — E119 Type 2 diabetes mellitus without complications: Secondary | ICD-10-CM | POA: Insufficient documentation

## 2022-09-27 DIAGNOSIS — Z1211 Encounter for screening for malignant neoplasm of colon: Secondary | ICD-10-CM | POA: Diagnosis present

## 2022-09-27 DIAGNOSIS — D125 Benign neoplasm of sigmoid colon: Secondary | ICD-10-CM | POA: Diagnosis not present

## 2022-09-27 DIAGNOSIS — G4730 Sleep apnea, unspecified: Secondary | ICD-10-CM | POA: Diagnosis not present

## 2022-09-27 DIAGNOSIS — Z7901 Long term (current) use of anticoagulants: Secondary | ICD-10-CM | POA: Insufficient documentation

## 2022-09-27 DIAGNOSIS — D123 Benign neoplasm of transverse colon: Secondary | ICD-10-CM | POA: Diagnosis not present

## 2022-09-27 DIAGNOSIS — D124 Benign neoplasm of descending colon: Secondary | ICD-10-CM | POA: Diagnosis not present

## 2022-09-27 DIAGNOSIS — F17210 Nicotine dependence, cigarettes, uncomplicated: Secondary | ICD-10-CM | POA: Diagnosis not present

## 2022-09-27 HISTORY — PX: COLONOSCOPY WITH PROPOFOL: SHX5780

## 2022-09-27 HISTORY — PX: POLYPECTOMY: SHX5525

## 2022-09-27 LAB — GLUCOSE, CAPILLARY: Glucose-Capillary: 126 mg/dL — ABNORMAL HIGH (ref 70–99)

## 2022-09-27 SURGERY — COLONOSCOPY WITH PROPOFOL
Anesthesia: Monitor Anesthesia Care

## 2022-09-27 MED ORDER — PROPOFOL 500 MG/50ML IV EMUL
INTRAVENOUS | Status: DC | PRN
  Administered 2022-09-27: 100 ug/kg/min via INTRAVENOUS

## 2022-09-27 MED ORDER — PROPOFOL 1000 MG/100ML IV EMUL
INTRAVENOUS | Status: AC
  Filled 2022-09-27: qty 100

## 2022-09-27 MED ORDER — SODIUM CHLORIDE 0.9 % IV SOLN: INTRAVENOUS | Status: DC

## 2022-09-27 MED ORDER — PROPOFOL 10 MG/ML IV BOLUS
INTRAVENOUS | Status: DC | PRN
  Administered 2022-09-27: 20 mg via INTRAVENOUS
  Administered 2022-09-27: 30 mg via INTRAVENOUS
  Administered 2022-09-27: 20 mg via INTRAVENOUS

## 2022-09-27 MED ORDER — PROPOFOL 10 MG/ML IV BOLUS
INTRAVENOUS | Status: AC
  Filled 2022-09-27: qty 20

## 2022-09-27 MED ORDER — LACTATED RINGERS IV SOLN
INTRAVENOUS | Status: AC | PRN
  Administered 2022-09-27: 1000 mL via INTRAVENOUS

## 2022-09-27 MED ORDER — LIDOCAINE 2% (20 MG/ML) 5 ML SYRINGE
INTRAMUSCULAR | Status: DC | PRN
  Administered 2022-09-27: 100 mg via INTRAVENOUS

## 2022-09-27 SURGICAL SUPPLY — 22 items

## 2022-09-27 NOTE — Transfer of Care (Signed)
Immediate Anesthesia Transfer of Care Note  Patient: Victor [name]  Procedure(s) Performed: COLONOSCOPY WITH PROPOFOL POLYPECTOMY  Patient Location: Endoscopy Unit  Anesthesia Type:MAC  Level of Consciousness: drowsy and patient cooperative  Airway & Oxygen Therapy: Patient Spontanous Breathing and Patient connected to face mask oxygen  Post-op Assessment: Report given to RN and Post -op Vital signs reviewed and stable  Post vital signs: Reviewed and stable  Last Vitals:  Vitals Value Taken Time  BP 109/40 09/27/22 0832  Temp    Pulse 76 09/27/22 0834  Resp 15 09/27/22 0834  SpO2 96 % 09/27/22 0834  Vitals shown include unvalidated device data.  Last Pain:  Vitals:   09/27/22 0710  TempSrc: Tympanic  PainSc: 0-No pain         Complications: No notable events documented.

## 2022-09-27 NOTE — Anesthesia Preprocedure Evaluation (Addendum)
Anesthesia Evaluation  Patient identified by MRN, date of birth, ID band Patient awake    Reviewed: Allergy & Precautions, NPO status , Patient's Chart, lab work & pertinent test results, reviewed documented beta blocker date and time   History of Anesthesia Complications (+) PONV and history of anesthetic complications  Airway Mallampati: I  TM Distance: >3 FB Neck ROM: Full    Dental no notable dental hx. (+) Dental Advisory Given, Caps, Teeth Intact   Pulmonary sleep apnea and Continuous Positive Airway Pressure Ventilation , Current Smoker and Patient abstained from smoking.   Pulmonary exam normal breath sounds clear to auscultation       Cardiovascular hypertension, Pt. on medications and Pt. on home beta blockers +CHF  Normal cardiovascular exam+ dysrhythmias Atrial Fibrillation  Rhythm:Regular Rate:Normal  Echo 08/16/21 Mitral Valve: Mitral valve structure is normal. The leaflets are mildly  thickened and exhibit normal excursion.    Left Ventricle: Wall motion is normal.    Left Ventricle: Systolic function is normal. EF: 55-60%.    Left Ventricle: Doppler parameters indicate normal diastolic function.    Tricuspid Valve: The right ventricular systolic pressure is normal (<36  mmHg).   EKG 08/27/22 Sinus Rhythm, HR 77  -Right bundle branch block.  - Low voltage in precordial and limb leads.  - Can't exclude old septal infarct.  - Mild nonspecific ST changes in III.   Last dose of Entresto, Digoxin and Coreg yesterday      Neuro/Psych negative neurological ROS  negative psych ROS   GI/Hepatic negative GI ROS, Neg liver ROS,,,  Endo/Other  diabetes, Well Controlled, Type 2, Oral Hypoglycemic Agents  GL--1 RA - last dose 3/17  Renal/GU negative Renal ROS  negative genitourinary   Musculoskeletal negative musculoskeletal ROS (+)    Abdominal  (+) + obese  Peds  Hematology Eliquis therapy- last dose  3/21   Anesthesia Other Findings   Reproductive/Obstetrics                             Anesthesia Physical Anesthesia Plan  ASA: 3  Anesthesia Plan: MAC   Post-op Pain Management: Minimal or no pain anticipated   Induction: Intravenous  PONV Risk Score and Plan: 1 and Propofol infusion and Treatment may vary due to age or medical condition  Airway Management Planned: Natural Airway and Simple Face Mask  Additional Equipment: None  Intra-op Plan:   Post-operative Plan:   Informed Consent: I have reviewed the patients History and Physical, chart, labs and discussed the procedure including the risks, benefits and alternatives for the proposed anesthesia with the patient or authorized representative who has indicated his/her understanding and acceptance.     Dental advisory given  Plan Discussed with: CRNA and Anesthesiologist  Anesthesia Plan Comments:         Anesthesia Quick Evaluation

## 2022-09-27 NOTE — H&P (Signed)
Victor [name] HPI: This 67 year old white male presents to the office for colorectal cancer screening. He has 1-2 BM's per day with occasional diarrhea. He admits to consuming candies with artificial sweeteners. There is no obvious blood or mucus in the stool. He has a good appetite. He has lost 31 pounds over the last 6 years. He denies having any complaints of abdominal pain, nausea, vomiting, acid reflux, dysphagia or odynophagia. He denies having a family history of colon cancer, celiac sprue or IBD. His last colonoscopy done on 07/25/2015 revealed diverticulosis, a tubular adenoma  and hyperplastic polyp were removed.   Past Medical History:  Diagnosis Date   Diabetes mellitus without complication (HCC)    Hypertension    PONV (postoperative nausea and vomiting)    Sleep apnea    uses setting 11.5    Past Surgical History:  Procedure Laterality Date   COLONOSCOPY WITH PROPOFOL N/A 07/25/2015   Procedure: COLONOSCOPY WITH PROPOFOL;  Surgeon: Juanita Craver, MD;  Location: [facility];  Service: Endoscopy;  Laterality: N/A;   meniscus tendon repair Left    rotator cuff Bilateral     History reviewed. No pertinent family history.  Social History:  reports that he has been smoking cigarettes. He has a 60.00 pack-year smoking history. He has never used smokeless tobacco. He reports current alcohol use. He reports that he does not use drugs.  Allergies:  Allergies  Allergen Reactions   Amlodipine Swelling   Neomycin-Bacitracin Zn-Polymyx Swelling    Medications: Scheduled: Continuous:  sodium chloride     lactated ringers 1,000 mL (09/27/22 0716)    Results for orders placed or performed during the hospital encounter of 09/27/22 (from the past 24 hour(s))  Glucose, capillary     Status: Abnormal   Collection Time: 09/27/22  7:15 AM  Result Value Ref Range   Glucose-Capillary 126 (H) 70 - 99 mg/dL     No results found.  ROS:  As stated above in the HPI otherwise  negative.  Blood pressure (!) 153/76, pulse 77, temperature 97.6 F (36.4 C), temperature source Tympanic, resp. rate (!) 25, height 6\' 1"  (1.854 m), weight (!) 151.6 kg, SpO2 96 %.    PE: Gen: NAD, Alert and Oriented HEENT:  [place]/AT, EOMI Neck: Supple, no LAD Lungs: CTA Bilaterally CV: RRR without M/G/R ABD: Soft, NTND, +BS Ext: No C/C/E  Assessment/Plan: 1) Personal history of polyps - colonoscopy.  Legend Tumminello D 09/27/2022, 7:53 AM

## 2022-09-27 NOTE — Discharge Instructions (Signed)

## 2022-09-27 NOTE — Op Note (Signed)
[facility] Patient Name: Victor [name] Procedure Date: 09/27/2022 MRN: [id number] Attending MD: Carol Ada , MD, [id number] Date of Birth: 1956-06-09 CSN: [id number] Age: 67 Admit Type: Outpatient Procedure:                Colonoscopy Indications:              High risk colon cancer surveillance: Personal                            history of colonic polyps Providers:                Carol Ada, MD, Dulcy Fanny, Benetta Spar, Technician Referring MD:              Medicines:                Propofol per Anesthesia Complications:            No immediate complications. Estimated Blood Loss:     Estimated blood loss: none. Procedure:                Pre-Anesthesia Assessment:                           - Prior to the procedure, a History and Physical                            was performed, and patient medications and                            allergies were reviewed. The patient's tolerance of                            previous anesthesia was also reviewed. The risks                            and benefits of the procedure and the sedation                            options and risks were discussed with the patient.                            All questions were answered, and informed consent                            was obtained. Prior Anticoagulants: The patient has                            taken Xarelto (rivaroxaban), last dose was 2 days                            prior to procedure. ASA Grade Assessment: III - A  patient with severe systemic disease. After                            reviewing the risks and benefits, the patient was                            deemed in satisfactory condition to undergo the                            procedure.                           - Sedation was administered by an anesthesia                            professional. Deep sedation was attained.                            After obtaining informed consent, the colonoscope                            was passed under direct vision. Throughout the                            procedure, the patient's blood pressure, pulse, and                            oxygen saturations were monitored continuously. The                            CF-HQ190L ([id number]) Olympus colonoscope was                            introduced through the anus and advanced to the the                            cecum, identified by appendiceal orifice and                            ileocecal valve. The colonoscopy was performed                            without difficulty. The patient tolerated the                            procedure well. The quality of the bowel                            preparation was evaluated using the BBPS [facility]                            Bowel Preparation Scale) with scores of: Right                            Colon =  3 (entire mucosa seen well with no residual                            staining, small fragments of stool or opaque                            liquid), Transverse Colon = 3 (entire mucosa seen                            well with no residual staining, small fragments of                            stool or opaque liquid) and Left Colon = 2 (minor                            amount of residual staining, small fragments of                            stool and/or opaque liquid, but mucosa seen well).                            The total BBPS score equals 8. The quality of the                            bowel preparation was good. The ileocecal valve,                            appendiceal orifice, and rectum were photographed. Scope In: 8:08:30 AM Scope Out: 8:27:47 AM Scope Withdrawal Time: 0 hours 11 minutes 50 seconds  Total Procedure Duration: 0 hours 19 minutes 17 seconds  Findings:      Eight sessile and semi-pedunculated polyps were found in the sigmoid       colon, descending colon and  transverse colon. The polyps were 3 to 10 mm       in size. These polyps were removed with a cold snare. Resection and       retrieval were complete.      Scattered large-mouthed and small-mouthed diverticula were found in the       sigmoid colon and descending colon.      The largest polyp was removed with a hot snare. Impression:               - Eight 3 to 10 mm polyps in the sigmoid colon, in                            the descending colon and in the transverse colon,                            removed with a cold snare. Resected and retrieved.                           - Diverticulosis in the sigmoid colon and in the  descending colon. Moderate Sedation:      Not Applicable - Patient had care per Anesthesia. Recommendation:           - Patient has a contact number available for                            emergencies. The signs and symptoms of potential                            delayed complications were discussed with the                            patient. Return to normal activities tomorrow.                            Written discharge instructions were provided to the                            patient.                           - Resume previous diet.                           - Continue present medications.                           - Await pathology results.                           - Repeat colonoscopy in 3 years for surveillance.                           - Okay to resume Xarelto. Procedure Code(s):        --- Professional ---                           [phone], Colonoscopy, flexible; with removal of                            tumor(s), polyp(s), or other lesion(s) by snare                            technique Diagnosis Code(s):        --- Professional ---                           D12.5, Benign neoplasm of sigmoid colon                           Z86.010, Personal history of colonic polyps                           D12.4, Benign neoplasm of  descending colon                           D12.3, Benign neoplasm of transverse colon (  hepatic                            flexure or splenic flexure)                           K57.30, Diverticulosis of large intestine without                            perforation or abscess without bleeding CPT copyright 2022 [employer]. All rights reserved. The codes documented in this report are preliminary and upon coder review may  be revised to meet current compliance requirements. Carol Ada, MD Carol Ada, MD 09/27/2022 8:33:37 AM This report has been signed electronically. Number of Addenda: 0

## 2022-09-27 NOTE — Anesthesia Postprocedure Evaluation (Signed)
Anesthesia Post Note  Patient: Victor [name]  Procedure(s) Performed: COLONOSCOPY WITH PROPOFOL POLYPECTOMY     Patient location during evaluation: PACU Anesthesia Type: MAC Level of consciousness: awake and alert and oriented Pain management: pain level controlled Vital Signs Assessment: post-procedure vital signs reviewed and stable Respiratory status: spontaneous breathing, nonlabored ventilation and respiratory function stable Cardiovascular status: stable and blood pressure returned to baseline Postop Assessment: no apparent nausea or vomiting Anesthetic complications: no   No notable events documented.  Last Vitals:  Vitals:   09/27/22 0840 09/27/22 0850  BP: (!) 123/36 (!) 151/54  Pulse: 85 76  Resp: 17 (!) 22  Temp:    SpO2: 95% 93%    Last Pain:  Vitals:   09/27/22 0850  TempSrc:   PainSc: 0-No pain                 Quasim Doyon A.

## 2022-09-30 ENCOUNTER — Encounter (HOSPITAL_COMMUNITY): Payer: Self-pay | Admitting: Gastroenterology

## 2022-09-30 LAB — SURGICAL PATHOLOGY

## 2023-01-13 IMAGING — CT CT ABD-PELV W/ CM
1 series · 1 of 1 positions shown · IV contrast (iopamidol)
Comparison: None.

CLINICAL DATA: Ascites.  Weight gain.

EXAM:
CT ABDOMEN AND PELVIS WITH CONTRAST
TECHNIQUE: Multidetector CT imaging of the abdomen and pelvis was performed
using the standard protocol following bolus administration of
intravenous contrast.
CONTRAST:  100mL ELU1RV-XOO IOPAMIDOL (ELU1RV-XOO) INJECTION 61%

[Series 1: topogram 0.70 tr20 · coronal · 1.00mm/px · 1 of 1 slices shown]
[im 1/1]
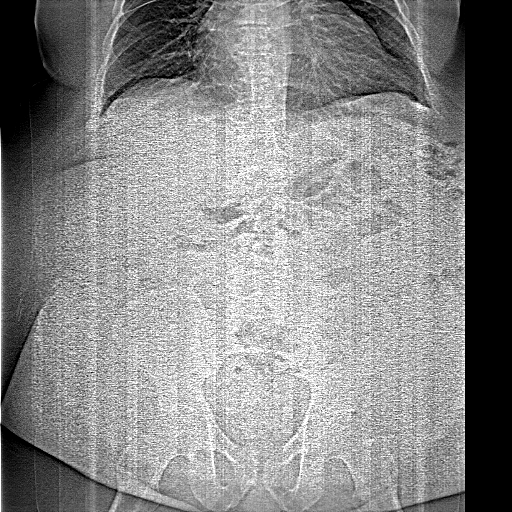

[1 of 1 positions shown; findings below may reference images not displayed]

FINDINGS: Lower chest: Small right pleural effusion is noted. Minimal
bilateral posterior basilar subsegmental atelectasis is noted.
Minimal pericardial effusion is noted. 3.5 x 1.5 cm opacity is seen
in right lower lobe adjacent to right major fissure most consistent
with focal atelectasis or infiltrate.

Hepatobiliary: No focal liver abnormality is seen. No gallstones,
gallbladder wall thickening, or biliary dilatation.

Pancreas: Unremarkable. No pancreatic ductal dilatation or
surrounding inflammatory changes.

Spleen: Normal in size without focal abnormality.

Adrenals/Urinary Tract: Adrenal glands are unremarkable. Right renal
cyst is noted. No hydronephrosis or renal obstruction is noted.
Urinary bladder is unremarkable.

Stomach/Bowel: Stomach is within normal limits. Appendix appears
normal. No evidence of bowel wall thickening, distention, or
inflammatory changes.

Vascular/Lymphatic: No significant vascular findings are present. No
enlarged abdominal or pelvic lymph nodes.

Reproductive: Prostate is unremarkable.

Other: Minimal ascites is noted in the pelvis and in both upper
quadrants. No hernia is noted.

Musculoskeletal: No acute or significant osseous findings.
IMPRESSION: Minimal ascites is noted.

Small right pleural effusion is noted. Minimal pericardial effusion
is noted.

3.5 x 1.5 cm opacity is seen in right lower lobe adjacent to right
major fissure most consistent with focal atelectasis or infiltrate.
Follow-up unenhanced chest CT in 3-4 weeks is recommended to ensure
resolution and rule out underlying neoplasm.

No other significant abnormality seen in the abdomen or pelvis.
